# Patient Record
Sex: Female | Born: 2000 | Race: Black or African American | Hispanic: No | Marital: Single
Health system: Southern US, Community
[De-identification: ages and names within clinical notes are randomized; demographics above are authoritative.]

## PROBLEM LIST (undated history)

## (undated) DIAGNOSIS — Q600 Renal agenesis, unilateral: Secondary | ICD-10-CM

---
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
## 2020-04-21 ENCOUNTER — Inpatient Hospital Stay (HOSPITAL_COMMUNITY)
Admission: RE | Admit: 2020-04-21 | Discharge: 2020-04-24 | DRG: 885 | Disposition: A | Discharge status: Home / Self Care | Payer: Medicaid Other | Attending: Psychiatry | Admitting: Psychiatry

## 2020-04-21 DIAGNOSIS — R45851 Suicidal ideations: Secondary | ICD-10-CM | POA: Diagnosis present

## 2020-04-21 DIAGNOSIS — Z79899 Other long term (current) drug therapy: Secondary | ICD-10-CM

## 2020-04-21 DIAGNOSIS — Z7722 Contact with and (suspected) exposure to environmental tobacco smoke (acute) (chronic): Secondary | ICD-10-CM | POA: Diagnosis present

## 2020-04-21 DIAGNOSIS — Z818 Family history of other mental and behavioral disorders: Secondary | ICD-10-CM | POA: Diagnosis not present

## 2020-04-21 DIAGNOSIS — F332 Major depressive disorder, recurrent severe without psychotic features: Principal | ICD-10-CM | POA: Diagnosis present

## 2020-04-21 DIAGNOSIS — F4310 Post-traumatic stress disorder, unspecified: Secondary | ICD-10-CM | POA: Diagnosis present

## 2020-04-21 DIAGNOSIS — G4700 Insomnia, unspecified: Secondary | ICD-10-CM | POA: Diagnosis present

## 2020-04-21 DIAGNOSIS — Z20822 Contact with and (suspected) exposure to covid-19: Secondary | ICD-10-CM | POA: Diagnosis present

## 2020-04-21 MED ORDER — ALUM & MAG HYDROXIDE-SIMETH 200-200-20 MG/5ML PO SUSP: 30.0000 mL | ORAL | Status: DC | PRN

## 2020-04-21 MED ORDER — TRAZODONE HCL 50 MG PO TABS
50.0000 mg | ORAL_TABLET | Freq: Every evening | ORAL | Status: DC | PRN
  Administered 2020-04-22 – 2020-04-23 (×3): 50 mg via ORAL
  Filled 2020-04-21 (×4): qty 1

## 2020-04-21 MED ORDER — MAGNESIUM HYDROXIDE 400 MG/5ML PO SUSP: 30.0000 mL | Freq: Every day | ORAL | Status: DC | PRN

## 2020-04-21 MED ORDER — HYDROXYZINE HCL 25 MG PO TABS
25.0000 mg | ORAL_TABLET | Freq: Three times a day (TID) | ORAL | Status: DC | PRN
  Administered 2020-04-22 – 2020-04-24 (×5): 25 mg via ORAL
  Filled 2020-04-21 (×7): qty 1

## 2020-04-21 MED ORDER — ACETAMINOPHEN 325 MG PO TABS
650.0000 mg | ORAL_TABLET | Freq: Four times a day (QID) | ORAL | Status: DC | PRN
  Administered 2020-04-23 – 2020-04-24 (×2): 650 mg via ORAL
  Filled 2020-04-21 (×2): qty 2
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
## 2020-04-22 ENCOUNTER — Encounter (HOSPITAL_COMMUNITY): Payer: Self-pay | Admitting: Physician Assistant

## 2020-04-22 ENCOUNTER — Other Ambulatory Visit: Payer: Self-pay

## 2020-04-22 LAB — COMPREHENSIVE METABOLIC PANEL
ALT: 9 U/L (ref 0–44)
AST: 15 U/L (ref 15–41)
Albumin: 4.3 g/dL (ref 3.5–5.0)
Alkaline Phosphatase: 55 U/L (ref 38–126)
Anion gap: 8 (ref 5–15)
BUN: 18 mg/dL (ref 6–20)
CO2: 22 mmol/L (ref 22–32)
Calcium: 9.5 mg/dL (ref 8.9–10.3)
Chloride: 107 mmol/L (ref 98–111)
Creatinine, Ser: 0.87 mg/dL (ref 0.44–1.00)
GFR, Estimated: >60 mL/min (ref >60)
Glucose, Bld: 84 mg/dL (ref 70–99)
Potassium: 3.9 mmol/L (ref 3.5–5.1)
Sodium: 137 mmol/L (ref 135–145)
Total Bilirubin: 1.2 mg/dL (ref 0.3–1.2)
Total Protein: 7.9 g/dL (ref 6.5–8.1)

## 2020-04-22 LAB — LIPID PANEL
Cholesterol: 124 mg/dL (ref 0–200)
HDL: 28 mg/dL — ABNORMAL LOW (ref >40)
LDL Cholesterol: 83 mg/dL (ref 0–99)
Total CHOL/HDL Ratio: 4.4 RATIO
Triglycerides: 66 mg/dL (ref <150)
VLDL: 13 mg/dL (ref 0–40)

## 2020-04-22 LAB — HEMOGLOBIN A1C
Hgb A1c MFr Bld: 5.3 % (ref 4.8–5.6)
Mean Plasma Glucose: 105.41 mg/dL

## 2020-04-22 LAB — TSH: TSH: 2.768 u[IU]/mL (ref 0.350–4.500)

## 2020-04-22 LAB — RESP PANEL BY RT-PCR (FLU A&B, COVID) ARPGX2
Influenza A by PCR: NEGATIVE
Influenza B by PCR: NEGATIVE
SARS Coronavirus 2 by RT PCR: NEGATIVE

## 2020-04-22 MED ORDER — ESCITALOPRAM OXALATE 5 MG PO TABS
5.0000 mg | ORAL_TABLET | Freq: Every day | ORAL | Status: DC
  Administered 2020-04-22 – 2020-04-24 (×3): 5 mg via ORAL
  Filled 2020-04-22 (×4): qty 1

## 2020-04-22 MED ORDER — IBUPROFEN 600 MG PO TABS
600.0000 mg | ORAL_TABLET | Freq: Four times a day (QID) | ORAL | Status: DC | PRN
  Administered 2020-04-22 (×2): 600 mg via ORAL
  Filled 2020-04-22 (×2): qty 1

## 2020-04-22 NOTE — Progress Notes (Signed)
[facility] Group Notes:  (Nursing/MHT/Case Management/Adjunct)  Date:  04/22/2020  Time:  2015  Type of Therapy:  wrap up group  Participation Level:  Active  Participation Quality:  Appropriate, Attentive, Sharing and Supportive  Affect:  Anxious and Depressed  Cognitive:  Appropriate  Insight:  Improving  Engagement in Group:  Engaged  Modes of Intervention:  Clarification, Education and Support  Summary of Progress/Problems: Positive thinking and positive change were discussed.   Marcille Buffy 04/22/2020, 11:10 PM

## 2020-04-22 NOTE — Progress Notes (Signed)
   04/22/20 2245  COVID-19 Daily Checkoff  Have you had a fever (temp > 37.80C/100F)  in the past 24 hours?  No  If you have had runny nose, nasal congestion, sneezing in the past 24 hours, has it worsened? No  COVID-19 EXPOSURE  Have you traveled outside the state in the past 14 days? No  Have you been in contact with someone with a confirmed diagnosis of COVID-19 or PUI in the past 14 days without wearing appropriate PPE? No  Have you been living in the same home as a person with confirmed diagnosis of COVID-19 or a PUI (household contact)? No  Have you been diagnosed with COVID-19? No

## 2020-04-22 NOTE — Progress Notes (Signed)
D: Patient denies SI/HI/AVH. Pt. Rated anxiety 6/10 and denied depression. Pt. Complained of "period cramps" Pt. Had brief eye contact. A:  Patient took scheduled medicine. Pt had 600 mg of Ibuprofen for pain.  Support and encouragement provided Routine safety checks conducted every 15 minutes. Patient  Informed to notify staff with any concerns.  R: Safety maintained.
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
## 2020-04-22 NOTE — BHH Group Notes (Signed)
.  Psychoeducational Group Note  Date: 04/22/2020 Time: 0900-1000    Goal Setting   Purpose of Group: This group helps to provide patients with the steps of setting a goal that is specific, measurable, attainable, realistic and time specific. A discussion on how we keep ourselves stuck with negative self talk.    Participation Level:  Did not attend   Coleton Woon A  

## 2020-04-23 LAB — RAPID URINE DRUG SCREEN, HOSP PERFORMED
Amphetamines: NOT DETECTED
Barbiturates: NOT DETECTED
Benzodiazepines: NOT DETECTED
Cocaine: NOT DETECTED
Opiates: NOT DETECTED
Tetrahydrocannabinol: POSITIVE — AB

## 2020-04-23 LAB — PREGNANCY, URINE: Preg Test, Ur: NEGATIVE

## 2020-04-23 NOTE — Progress Notes (Signed)
D.  The patient became tearful after group and stated that she was sad because in group they spoke of valuing yourself and she did not feel that she was doing that.  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    A.  Writer encouraged patient, expressed how talented she is in her drawing and design.  Writer also praised Pt for coming in for help, and explained that this is also an example of self value.  Support and encouragement offered.  R.  Pt smiled at compliments, presently writing in journal before bed.  Will continue to monitor.

## 2020-04-23 NOTE — Progress Notes (Signed)
D:  Patient's self inventory sheet, patient sleeps good, sleep medication helpful.  Fair appetite, normal energy level, good concentration.  Rated depression and anxiety 4, hopeless 2.  Denied withdrawals.  Denied SI.  Denied physical problems.  Denied physical pain.  Goal is open up to MD, have a more positive attitude.  Plans to journal and sleep less during day.  Discharge and care plan?  No discharge plans. A:  Medications administered per MD orders.  Emotional support and encouragement given patient. R:  Denied SI and HI, contracts for safety.  Denied A/V hallucinations.  Safety maintained with 15 minute checks.

## 2020-04-23 NOTE — Plan of Care (Signed)
Nurse discussed anxiety, depression and coping skills with patient.  

## 2020-04-23 NOTE — BHH Group Notes (Signed)
Adult Psychoeducational Group Not Date:  04/23/2020 Time:  0900-1045 Group Topic/Focus: PROGRESSIVE RELAXATION. A group where deep breathing is taught and tensing and relaxation muscle groups is used. Imagery is used as well.  Pts are asked to imagine 3 pillars that hold them up when they are not able to hold themselves up.  Participation Level:  Did not attend  Thermon Zulauf A    
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
## 2020-04-23 NOTE — [facility] Suicide Risk Assessment (Signed)
[facility] INPATIENT:  Family/Significant Other Suicide Prevention Education  Suicide Prevention Education:  Education Completed; Mother Marice Potter [phone],  (name of family member/significant other) has been identified by the patient as the family member/significant other with whom the patient will be residing, and identified as the person(s) who will aid the patient in the event of a mental health crisis (suicidal ideations/suicide attempt).  With written consent from the patient, the family member/significant other has been provided the following suicide prevention education, prior to the and/or following the discharge of the patient.  The suicide prevention education provided includes the following:  Suicide risk factors  Suicide prevention and interventions  National Suicide Hotline telephone number  [facility] assessment telephone number  [facility] Emergency Assistance 911  [facility] and/or Residential Mobile Crisis Unit telephone number  Request made of family/significant other to:  Remove weapons (e.g., guns, rifles, knives), all items previously/currently identified as safety concern.    Remove drugs/medications (over-the-counter, prescriptions, illicit drugs), all items previously/currently identified as a safety concern.  The family member/significant other verbalizes understanding of the suicide prevention education information provided.  The family member/significant other agrees to remove the items of safety concern listed above.  Mother is not sure if patient is ready to discharge from the hospital, but knows that the patient has been focused on that since arriving.  She states that the patient has a lot of anxiety and depression and is usually quite unsettled without having a specific plan.  That is one reason she has been unhappy with treatment so far, because she does not feel she has gotten specific direction or information about her treatment plan  and her discharge plan.  Mother stated that she believes the patient will be more greatly benefited by having a specific psychiatric appointment in place rather than a walk-in because of the way her anxiety builds with the "unknown."  Mother states that if the hospital staff feels that the patient is ready to discharge, she will come pick her up, but she thinks it would probably be beneficial for her to stay longer.  Mother recognizes that she has been able to participate in very little of the group therapy so far, and thinks that because the medicine and therapy go hand in hand, it may be good for her to get more group therapy.  Further, she stated that the patient told her and the original assessor that if she did not get help she would end up killing herself.  For that reason, she is concerned that we take our time in assessing her and ensuring that she is truly stable and not rush her out, which is what it appears to her is happening.  She feels that we may be reacting to patient's desire to leave, and asks that we make sure before discharging her.    CSW assured mother that we will be the decision makers about discharge, and that we are not simply leaving the decision to her.  Also assured her that we will not be telling patient she has to stay because mother says she is not ready.  Carloyn Jaeger Grossman-Orr 04/23/2020, 11:09 AM

## 2020-04-23 NOTE — Progress Notes (Signed)
   04/23/20 2232  COVID-19 Daily Checkoff  Have you had a fever (temp > 37.80C/100F)  in the past 24 hours?  No  If you have had runny nose, nasal congestion, sneezing in the past 24 hours, has it worsened? No  COVID-19 EXPOSURE  Have you traveled outside the state in the past 14 days? No  Have you been in contact with someone with a confirmed diagnosis of COVID-19 or PUI in the past 14 days without wearing appropriate PPE? No  Have you been living in the same home as a person with confirmed diagnosis of COVID-19 or a PUI (household contact)? No  Have you been diagnosed with COVID-19? No

## 2020-04-23 NOTE — [facility] Suicide Risk Assessment (Signed)
[facility] Discharge Suicide Risk Assessment   Principal Problem: <principal problem not specified> Discharge Diagnoses: Active Problems:   MDD (major depressive disorder), recurrent episode, severe (HCC)   Total Time spent with patient: 20 minutes  Musculoskeletal: Strength & Muscle Tone: within normal limits Gait & Station: normal Patient leans: N/A  Psychiatric Specialty Exam: Review of Systems  All other systems reviewed and are negative.   Blood pressure 138/88, pulse 88, temperature 98.1 F (36.7 C), temperature source Oral, resp. rate 16, height 5\' 2"  (1.575 m), weight 65.8 kg, SpO2 97 %.Body mass index is 26.52 kg/m.  General Appearance: Casual  Eye Contact::  Fair  Speech:  Normal Rate409  Volume:  Normal  Mood:  Euthymic  Affect:  Congruent  Thought Process:  Coherent and Descriptions of Associations: Intact  Orientation:  Full (Time, Place, and Person)  Thought Content:  Logical  Suicidal Thoughts:  No  Homicidal Thoughts:  No  Memory:  Immediate;   Fair Recent;   Fair Remote;   Fair  Judgement:  Intact  Insight:  Fair  Psychomotor Activity:  Normal  Concentration:  Good  Recall:  Good  Fund of Knowledge:Good  Language: Good  Akathisia:  Negative  Handed:  Right  AIMS (if indicated):     Assets:  Desire for Improvement Housing Resilience Social Support  Sleep:  Number of Hours: 6  Cognition: WNL  ADL's:  Intact   Mental Status Per Nursing Assessment::   On Admission:  Suicidal ideation indicated by patient  Demographic Factors:  Unemployed  Loss Factors: NA  Historical Factors: Impulsivity  Risk Reduction Factors:   Living with another person, especially a relative and Positive social support  Continued Clinical Symptoms:  Depression:   Impulsivity  Cognitive Features That Contribute To Risk:  None    Suicide Risk:  Minimal: No identifiable suicidal ideation.  Patients presenting with no risk factors but with morbid ruminations; may be  classified as minimal risk based on the severity of the depressive symptoms    Plan Of Care/Follow-up recommendations:  Activity:  ad lib  [phone], MD 04/23/2020, 10:16 AM

## 2020-04-23 NOTE — [facility] Group Notes (Signed)
Psychoeducational Group Note  Date: 04-23-20 Time:  1300  Group Topic/Focus:  Making Healthy Choices:   The focus of this group is to help patients identify negative/unhealthy choices they were using prior to admission and identify positive/healthier coping strategies to replace them upon discharge.In this group, patients started asking about the brain and how the brain works with and how the chemicals work for those who use substances, the pros and cons of saboxone.  Participation Level:  Active  Participation Quality:  Appropriate  Affect:  Appropriate  Cognitive:  Oriented  Insight:  Improving  Engagement in Group:  Engaged  Additional Comments:rates her energy at a 7/10.Marland Kitchen  Partisipated fully in the group.  Dione Housekeeper

## 2020-04-23 NOTE — Progress Notes (Signed)
[facility] MD Progress Note  04/23/2020 2:37 PM Krystal Potts  MRN:  [id number] Subjective:  " I am doing well, I think I am ready to go home"  Objective:  Record reviewed, Nursing note reviewed and patient was interviewed this morning.  Patient is happy starting low dose Lexapro stating that she did not feel groggy this morning.  Patient reported 6-7 hours sleep last night and rated her depression and anxiety 5/10 with 10 being severe depression and anxiety.  Patient started participating in therapy and would continue therapy as she was doing before admission.  We discussed coping skills and the need to go back to school.  Patient states she feels much calmer and concentrated much better since last night.  She credits starting Lexapro to little improvement in her mood.  Patient also declined an increase in her Lexapro for now.  She denied feeling suicidal or having suicidal thoughts. We discussed the dangers associated with using Cannabis and taking prescribed medications.  Patient agreed but then states she only uses socially which provider advised her to stop using at all due to her intense anxiety.   Lab results were reviewed and all Normal.  Urine preg was ordered stat. Patient was evaluated by DR Jola Babinski for discharge today and he completed suicide risk assessment.  Patient's mother was hesitant letting her come home today due the intensity of her depression and anxiety before coming to the hospital.  Patient will be monitored one more day and discharged tomorrow. Principal Problem: <principal problem not specified> Diagnosis: Active Problems:   MDD (major depressive disorder), recurrent episode, severe (HCC)  Total Time spent with patient: 25 minutes  Past Psychiatric History:  PTSD, Depression and anxiety  Past Medical History:  Past Medical History:  Diagnosis Date  . Congenital absence of one kidney    Right present    Past Surgical History:  Procedure Laterality Date  . URETHRAL PROLAPSE  EXCISION  2008   Family History: History reviewed. No pertinent family history. Family Psychiatric  History: Denies Social History:  Social History   Substance and Sexual Activity  Alcohol Use No     Social History   Substance and Sexual Activity  Drug Use No    Social History   Socioeconomic History  . Marital status: Single    Spouse name: Not on file  . Number of children: Not on file  . Years of education: Not on file  . Highest education level: Not on file  Occupational History  . Not on file  Tobacco Use  . Smoking status: Passive Smoke Exposure - Never Smoker  . Smokeless tobacco: Never Used  Substance and Sexual Activity  . Alcohol use: No  . Drug use: No  . Sexual activity: Never  Other Topics Concern  . Not on file  Social History Narrative  . Not on file   Social Determinants of Health   Financial Resource Strain: Not on file  Food Insecurity: Not on file  Transportation Needs: Not on file  Physical Activity: Not on file  Stress: Not on file  Social Connections: Not on file   Additional Social History:    Pain Medications: None Prescriptions: None Over the Counter: None History of alcohol / drug use?: Yes Name of Substance 1: Marijuana 1 - Age of First Use: 20 years of age 21 - Amount (size/oz): Varies 1 - Frequency: When she is around friends 1 - Duration: off and on 1 - Last Use / Amount: Around birthday in  march                  Sleep: Good  Appetite:  Fair  Current Medications: Current Facility-Administered Medications  Medication Dose Route Frequency Provider Last Rate Last Admin  . acetaminophen (TYLENOL) tablet 650 mg  650 mg Oral Q6H PRN Nwoko, Uchenna E, PA      . alum & mag hydroxide-simeth (MAALOX/MYLANTA) 200-200-20 MG/5ML suspension 30 mL  30 mL Oral Q4H PRN Nwoko, Uchenna E, PA      . escitalopram (LEXAPRO) tablet 5 mg  5 mg Oral Daily Rontavious Albright C, NP   5 mg at 04/23/20 0742  . hydrOXYzine (ATARAX/VISTARIL)  tablet 25 mg  25 mg Oral TID PRN Nwoko, Uchenna E, PA   25 mg at 04/23/20 0743  . ibuprofen (ADVIL) tablet 600 mg  600 mg Oral Q6H PRN Nwoko, Uchenna E, PA   600 mg at 04/22/20 7616  . magnesium hydroxide (MILK OF MAGNESIA) suspension 30 mL  30 mL Oral Daily PRN Nwoko, Uchenna E, PA      . traZODone (DESYREL) tablet 50 mg  50 mg Oral QHS PRN Nwoko, Uchenna E, PA   50 mg at 04/22/20 2109    Lab Results:  Results for orders placed or performed during the hospital encounter of 04/21/20 (from the past 48 hour(s))  Resp Panel by RT-PCR (Flu A&B, Covid) Nasopharyngeal Swab     Status: None   Collection Time: 04/21/20 10:39 PM   Specimen: Nasopharyngeal Swab; Nasopharyngeal(NP) swabs in vial transport medium  Result Value Ref Range   SARS Coronavirus 2 by RT PCR NEGATIVE NEGATIVE    Comment: (NOTE) SARS-CoV-2 target nucleic acids are NOT DETECTED.  The SARS-CoV-2 RNA is generally detectable in upper respiratory specimens during the acute phase of infection. The lowest concentration of SARS-CoV-2 viral copies this assay can detect is 138 copies/mL. A negative result does not preclude SARS-Cov-2 infection and should not be used as the sole basis for treatment or other patient management decisions. A negative result may occur with  improper specimen collection/handling, submission of specimen other than nasopharyngeal swab, presence of viral mutation(s) within the areas targeted by this assay, and inadequate number of viral copies(<138 copies/mL). A negative result must be combined with clinical observations, patient history, and epidemiological information. The expected result is Negative.  Fact Sheet for Patients:  BloggerCourse.com  Fact Sheet for Healthcare Providers:  SeriousBroker.it  This test is no t yet approved or cleared by the [place] FDA and  has been authorized for detection and/or diagnosis of SARS-CoV-2 by FDA under an  Emergency Use Authorization (EUA). This EUA will remain  in effect (meaning this test can be used) for the duration of the COVID-19 declaration under Section 564(b)(1) of the Act, 21 U.S.C.section 360bbb-3(b)(1), unless the authorization is terminated  or revoked sooner.       Influenza A by PCR NEGATIVE NEGATIVE   Influenza B by PCR NEGATIVE NEGATIVE    Comment: (NOTE) The Xpert Xpress SARS-CoV-2/FLU/RSV plus assay is intended as an aid in the diagnosis of influenza from Nasopharyngeal swab specimens and should not be used as a sole basis for treatment. Nasal washings and aspirates are unacceptable for Xpert Xpress SARS-CoV-2/FLU/RSV testing.  Fact Sheet for Patients: BloggerCourse.com  Fact Sheet for Healthcare Providers: SeriousBroker.it  This test is not yet approved or cleared by the [place] FDA and has been authorized for detection and/or diagnosis of SARS-CoV-2 by FDA under an Emergency Use Authorization (EUA).  This EUA will remain in effect (meaning this test can be used) for the duration of the COVID-19 declaration under Section 564(b)(1) of the Act, 21 U.S.C. section 360bbb-3(b)(1), unless the authorization is terminated or revoked.  Performed at [facility], [address] [place], [place] [address]   Comprehensive metabolic panel     Status: None   Collection Time: 04/22/20  4:34 PM  Result Value Ref Range   Sodium 137 135 - 145 mmol/L   Potassium 3.9 3.5 - 5.1 mmol/L   Chloride 107 98 - 111 mmol/L   CO2 22 22 - 32 mmol/L   Glucose, Bld 84 70 - 99 mg/dL    Comment: Glucose reference range applies only to samples taken after fasting for at least 8 hours.   BUN 18 6 - 20 mg/dL   Creatinine, Ser 7.820.87 0.44 - 1.00 mg/dL   Calcium 9.5 8.9 - 95.610.3 mg/dL   Total Protein 7.9 6.5 - 8.1 g/dL   Albumin 4.3 3.5 - 5.0 g/dL   AST 15 15 - 41 U/L   ALT 9 0 - 44 U/L   Alkaline Phosphatase 55 38 -  126 U/L   Total Bilirubin 1.2 0.3 - 1.2 mg/dL   GFR, Estimated >21>60 >30>60 mL/min    Comment: (NOTE) Calculated using the CKD-EPI Creatinine Equation (2021)    Anion gap 8 5 - 15    Comment: Performed at [facility], [address] [place], [place] [address]  Hemoglobin A1c     Status: None   Collection Time: 04/22/20  4:34 PM  Result Value Ref Range   Hgb A1c MFr Bld 5.3 4.8 - 5.6 %    Comment: (NOTE) Pre diabetes:          5.7%-6.4%  Diabetes:              >6.4%  Glycemic control for   <7.0% adults with diabetes    Mean Plasma Glucose 105.41 mg/dL    Comment: Performed at [facility], [address] [place], [place] [address]  Lipid panel     Status: Abnormal   Collection Time: 04/22/20  4:34 PM  Result Value Ref Range   Cholesterol 124 0 - 200 mg/dL   Triglycerides 66 <952<150 mg/dL   HDL 28 (L) >84>40 mg/dL   Total CHOL/HDL Ratio 4.4 RATIO   VLDL 13 0 - 40 mg/dL   LDL Cholesterol 83 0 - 99 mg/dL    Comment:        Total Cholesterol/HDL:CHD Risk Coronary Heart Disease Risk Table                     Men   Women  1/2 Average Risk   3.4   3.3  Average Risk       5.0   4.4  2 X Average Risk   9.6   7.1  3 X Average Risk  23.4   11.0        Use the calculated Patient Ratio above and the CHD Risk Table to determine the patient's CHD Risk.        ATP III CLASSIFICATION (LDL):  <100     mg/dL   Optimal  132-440100-129  mg/dL   Near or Above                    Optimal  130-159  mg/dL   Borderline  102-725160-189  mg/dL   High  >  190     mg/dL   Very High Performed at [facility], [address], [place], [place] [address]   TSH     Status: None   Collection Time: 04/22/20  4:34 PM  Result Value Ref Range   TSH 2.768 0.350 - 4.500 uIU/mL    Comment: Performed by a 3rd Generation assay with a functional sensitivity of <=0.01 uIU/mL. Performed at [facility], [address], [place], [place] [address]     Blood Alcohol  level:  No results found for: [facility]  Metabolic Disorder Labs: Lab Results  Component Value Date   HGBA1C 5.3 04/22/2020   MPG 105.41 04/22/2020   No results found for: PROLACTIN Lab Results  Component Value Date   CHOL 124 04/22/2020   TRIG 66 04/22/2020   HDL 28 (L) 04/22/2020   CHOLHDL 4.4 04/22/2020   VLDL 13 04/22/2020   LDLCALC 83 04/22/2020    Physical Findings: AIMS: Facial and Oral Movements Muscles of Facial Expression: None, normal Lips and Perioral Area: None, normal Jaw: None, normal Tongue: None, normal,Extremity Movements Upper (arms, wrists, hands, fingers): None, normal Lower (legs, knees, ankles, toes): None, normal, Trunk Movements Neck, shoulders, hips: None, normal, Overall Severity Severity of abnormal movements (highest score from questions above): None, normal Incapacitation due to abnormal movements: None, normal Patient's awareness of abnormal movements (rate only patient's report): No Awareness, Dental Status Current problems with teeth and/or dentures?: No Does patient usually wear dentures?: No  CIWA:    COWS:     Musculoskeletal: Strength & Muscle Tone: within normal limits Gait & Station: normal Patient leans: Front  Psychiatric Specialty Exam:  Presentation  General Appearance: Appropriate for Environment; Neat  Eye Contact:Good  Speech:Clear and Coherent; Normal Rate  Speech Volume:Normal  Handedness:Right   Mood and Affect  Mood:Anxious; Depressed; Dysphoric; Worthless  Affect:Congruent; Depressed   Thought Process  Thought Processes:Coherent; Goal Directed  Descriptions of Associations:Intact  Orientation:Full (Time, Place and Person)  Thought Content:Logical; WDL  History of Schizophrenia/Schizoaffective disorder:No data recorded Duration of Psychotic Symptoms:No data recorded Hallucinations:No data recorded Ideas of Reference:None  Suicidal Thoughts:No data recorded Homicidal Thoughts:No data  recorded  Sensorium  Memory:Immediate Good; Recent Good; Remote Good  Judgment:Good  Insight:Good   Executive Functions  Concentration:Good  Attention Span:Good  Recall:Good  Fund of Knowledge:Good  Language:Good   Psychomotor Activity  Psychomotor Activity:No data recorded  Assets  Assets:Communication Skills; Desire for Improvement; Housing; Social Support   Sleep  Sleep:No data recorded   Physical Exam: Physical Exam Vitals and nursing note reviewed.  Constitutional:      Appearance: Normal appearance.  HENT:     Head: Normocephalic and atraumatic.  Cardiovascular:     Rate and Rhythm: Normal rate and regular rhythm.     Pulses: Normal pulses.  Pulmonary:     Effort: Pulmonary effort is normal.  Musculoskeletal:     Cervical back: Normal range of motion.  Neurological:     Mental Status: She is alert.    Review of Systems  Constitutional: Negative.   HENT: Negative.   Eyes: Negative.   Respiratory: Negative.   Cardiovascular: Negative.   Genitourinary: Negative.   Skin: Negative.   Endo/Heme/Allergies: Negative.   Psychiatric/Behavioral: Positive for depression. The patient is nervous/anxious.    Blood pressure 138/88, pulse 88, temperature 98.1 F (36.7 C), temperature source Oral, resp. rate 16, height 5\' 2"  (1.575 m), weight 65.8 kg, SpO2 97 %. Body mass index is 26.52 kg/m.  Treatment Plan Summary: Daily contact with patient to assess and evaluate symptoms and progress in treatment and Medication management  -Plan: -Continue  Escitalopram 5 mg po daily for depression and anxiety -Offer Trazodone 50 mg po at bed time for sleep -Encourage group and activity participation while in the unit -Plan for discharge tomorrow Earney Navy, NP-PMHNP-BC 04/23/2020, 2:37 PM

## 2020-04-24 DIAGNOSIS — F332 Major depressive disorder, recurrent severe without psychotic features: Principal | ICD-10-CM

## 2020-04-24 MED ORDER — ESCITALOPRAM OXALATE 10 MG PO TABS: 10.0000 mg | ORAL_TABLET | Freq: Every day | ORAL | 0 refills | Status: AC

## 2020-04-24 MED ORDER — TRAZODONE HCL 50 MG PO TABS: 50.0000 mg | ORAL_TABLET | Freq: Every evening | ORAL | 0 refills | Status: AC | PRN

## 2020-04-24 MED ORDER — ESCITALOPRAM OXALATE 10 MG PO TABS
10.0000 mg | ORAL_TABLET | Freq: Every day | ORAL | Status: DC
  Filled 2020-04-24 (×2): qty 1

## 2020-04-24 MED ORDER — HYDROXYZINE HCL 25 MG PO TABS: 25.0000 mg | ORAL_TABLET | Freq: Three times a day (TID) | ORAL | 0 refills | Status: AC | PRN
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
## 2020-04-24 NOTE — Progress Notes (Signed)
Discharge Note:  Patient denies SI/HI AVH at this time. Discharge instructions, AVS, prescriptions and transition record gone over with patient. Patient agrees to comply with medication management, follow-up visit, and outpatient therapy. Patient belongings returned to patient. Patient questions and concerns addressed and answered.  Patient ambulatory off unit.  Patient discharged to home with friend.   

## 2020-04-24 NOTE — Progress Notes (Signed)
Recreation Therapy Notes  Date: 4.11.22 Time: 0930 Location: [address] Dayroom  Group Topic: Stress Management   Goal Area(s) Addresses:  Patient will actively participate in stress management techniques presented during session.  Patient will successfully identify benefit of practicing stress management post d/c.   Behavioral Response: Appropriate  Intervention: Guided exercise with ambient sound and script  Activity :Guided Imagery  LRT read a script that focused envisioning being in a meadow in the summer and enjoying the sights, sounds and feeling of being outdoors.  Patients were to listen and follow along as script was read to fully engage in activity.   Education:  Stress Management, Discharge Planning.   Education Outcome: Acknowledges education  Clinical Observations/Feedback: Patient attended and participated in activity.   Caroll Rancher, LRT/CTRS    Lillia Abed, [employer] [facility] 04/24/2020 10:45 AM

## 2020-04-24 NOTE — BHH Group Notes (Signed)
LCSW Group Therapy Note  Type of Therapy/Topic: Group Therapy: Six Dimensions of Wellness  Participation Level: Did Not Attend  Description of Group: This group will address the concept of wellness and the six concepts of wellness: occupational, physical, social, intellectual, spiritual, and emotional. Patients will be encouraged to process areas in their lives that are out of balance and identify reasons for remaining unbalanced. Patients will be encouraged to explore ways to practice healthy habits daily to attain better physical and mental health outcomes.  Therapeutic Goals: 1. Identify aspects of wellness that they are doing well. 2. Identify aspects of wellness that they would like to improve upon. 3. Identify one action they can take to improve an aspect of wellness in their lives.     Therapeutic Modalities: Cognitive Behavioral Therapy Solution-Focused Therapy Relapse Prevention   Cylas Falzone, LCSWA Clinicial Social Worker [facility]  

## 2020-04-24 NOTE — Progress Notes (Signed)
  [facility] Adult Case Management Discharge Plan :  Will you be returning to the same living situation after discharge:  Yes,  mother's home At discharge, do you have transportation home?: Yes,  mother Do you have the ability to pay for your medications: Yes,  medicaid  Release of information consent forms completed and in the chart;  Patient's signature needed at discharge.  Patient to Follow up at:  Follow-up Information    [facility], [facility] [place]. Go on 04/27/2020.   Why: You have a hospital follow up appointment for therapy and medication management services on 04/27/20 on 1:00 pm.  This appointment will be held in person. Contact information: [address] [place] [address] [phone]               Next level of care provider has access to [facility] Link:no  Safety Planning and Suicide Prevention discussed: Yes,  w/ mother     Has patient been referred to the Quitline?: Patient refused referral  Patient has been referred for addiction treatment: N/A  Felizardo Hoffmann, Theresia Majors 04/24/2020, 10:29 AM

## 2020-04-24 NOTE — BHH Suicide Risk Assessment (Signed)
[facility] Discharge Suicide Risk Assessment   Principal Problem: <principal problem not specified> Discharge Diagnoses: Active Problems:   MDD (major depressive disorder), recurrent episode, severe (HCC)   Total Time spent with patient: 20 minutes  Musculoskeletal: Strength & Muscle Tone: within normal limits Gait & Station: normal Patient leans: N/A  Psychiatric Specialty Exam: Review of Systems  All other systems reviewed and are negative.   Blood pressure 122/64, pulse 64, temperature 98.1 F (36.7 C), temperature source Oral, resp. rate 16, height 5\' 2"  (1.575 m), weight 65.8 kg, SpO2 100 %.Body mass index is 26.52 kg/m.  General Appearance: Fairly Groomed  ::  Fair  Speech:  Normal Rate409  Volume:  Normal  Mood:  Anxious  Affect:  Congruent  Thought Process:  Coherent and Descriptions of Associations: Intact  Orientation:  Full (Time, Place, and Person)  Thought Content:  Logical  Suicidal Thoughts:  No  Homicidal Thoughts:  No  Memory:  Immediate;   Good Recent;   Good Remote;   Good  Judgement:  Intact  Insight:  Fair  Psychomotor Activity:  Normal  Concentration:  Fair  Recall:  [id number] of Knowledge:Fair  Language: Good  Akathisia:  Negative  Handed:  Right  AIMS (if indicated):     Assets:  Communication Skills Desire for Improvement Housing Resilience Talents/Skills  Sleep:  Number of Hours: 6.25  Cognition: WNL  ADL's:  Intact   Mental Status Per Nursing Assessment::   On Admission:  Suicidal ideation indicated by patient  Demographic Factors:  Low socioeconomic status and Unemployed  Loss Factors: Financial problems/change in socioeconomic status  Historical Factors: Impulsivity  Risk Reduction Factors:   Sense of responsibility to family and Living with another person, especially a relative  Continued Clinical Symptoms:  Depression:   Impulsivity  Cognitive Features That Contribute To Risk:  None    Suicide Risk:   Minimal: No identifiable suicidal ideation.  Patients presenting with no risk factors but with morbid ruminations; may be classified as minimal risk based on the severity of the depressive symptoms   Follow-up Information    [facility], [facility]. Go on 04/27/2020.   Why: You have a hospital follow up appointment for therapy and medication management services on 04/27/20 on 1:00 pm.  This appointment will be held in person. Contact information: [address] [place] [phone]               Plan Of Care/Follow-up recommendations:  Activity:  ad lib  [phone], MD 04/24/2020, 9:57 AM

## 2020-04-24 NOTE — BHH Group Notes (Signed)
ADULT GRIEF GROUP NOTE:   Spiritual care group on grief and loss facilitated by Kathleen Argue, [facility]   Group Goal:   Support / Education around grief and loss   Members engage in facilitated group support and psycho-social education.   Group Description:   Following introductions and group rules, group members engaged in facilitated group dialog and support around topic of loss, with particular support around experiences of loss in their lives. Group Identified types of loss (relationships / self / things) and identified patterns, circumstances, and changes that precipitate losses. Reflected on thoughts / feelings around loss, normalized grief responses, and recognized variety in grief experience. Group noted Worden's four tasks of grief in discussion.   Group drew on Adlerian / Rogerian, narrative, MI,   Patient Progress: Krystal Potts attended and participated in group.  She shared about the loss of a sibling to SIDs and also shared that she is trying to change her living circumstances to better care for herself but needs to be able to save money in order to move out of her home.  [address] Krystal Potts, B[facility] Pager, [phone] Office: [phone] 3:08 PM

## 2020-04-24 NOTE — Tx Team (Signed)
Interdisciplinary Treatment and Diagnostic Plan Update  04/24/2020 Time of Session: 10:30am Krystal Potts MRN: [id number]  Principal Diagnosis: <principal problem not specified>  Secondary Diagnoses: Active Problems:   MDD (major depressive disorder), recurrent episode, severe (HCC)   Current Medications:  Current Facility-Administered Medications  Medication Dose Route Frequency Provider Last Rate Last Admin  . acetaminophen (TYLENOL) tablet 650 mg  650 mg Oral Q6H PRN Nwoko, Uchenna E, PA   650 mg at 04/24/20 0806  . alum & mag hydroxide-simeth (MAALOX/MYLANTA) 200-200-20 MG/5ML suspension 30 mL  30 mL Oral Q4H PRN Nwoko, Uchenna E, PA      . [START ON 04/25/2020] escitalopram (LEXAPRO) tablet 10 mg  10 mg Oral Daily Sharma Covert, MD      . hydrOXYzine (ATARAX/VISTARIL) tablet 25 mg  25 mg Oral TID PRN Nwoko, Uchenna E, PA   25 mg at 04/24/20 0807  . ibuprofen (ADVIL) tablet 600 mg  600 mg Oral Q6H PRN Nwoko, Uchenna E, PA   600 mg at 04/22/20 1287  . magnesium hydroxide (MILK OF MAGNESIA) suspension 30 mL  30 mL Oral Daily PRN Nwoko, Uchenna E, PA      . traZODone (DESYREL) tablet 50 mg  50 mg Oral QHS PRN Nwoko, Uchenna E, PA   50 mg at 04/23/20 2127   PTA Medications: No medications prior to admission.    Patient Stressors:    Patient Strengths:    Treatment Modalities: Medication Management, Group therapy, Case management,  1 to 1 session with clinician, Psychoeducation, Recreational therapy.   Physician Treatment Plan for Primary Diagnosis: <principal problem not specified> Long Term Goal(s): Improvement in symptoms so as ready for discharge Improvement in symptoms so as ready for discharge   Short Term Goals: Ability to identify changes in lifestyle to reduce recurrence of condition will improve Ability to verbalize feelings will improve Ability to disclose and discuss suicidal ideas Ability to demonstrate self-control will improve Ability to identify and develop  effective coping behaviors will improve Ability to maintain clinical measurements within normal limits will improve Compliance with prescribed medications will improve Ability to identify triggers associated with substance abuse/mental health issues will improve Ability to identify changes in lifestyle to reduce recurrence of condition will improve Ability to verbalize feelings will improve Ability to disclose and discuss suicidal ideas Ability to demonstrate self-control will improve Ability to identify and develop effective coping behaviors will improve Ability to maintain clinical measurements within normal limits will improve Compliance with prescribed medications will improve Ability to identify triggers associated with substance abuse/mental health issues will improve  Medication Management: Evaluate patient's response, side effects, and tolerance of medication regimen.  Therapeutic Interventions: 1 to 1 sessions, Unit Group sessions and Medication administration.  Evaluation of Outcomes: Adequate for Discharge  Physician Treatment Plan for Secondary Diagnosis: Active Problems:   MDD (major depressive disorder), recurrent episode, severe ([place])  Long Term Goal(s): Improvement in symptoms so as ready for discharge Improvement in symptoms so as ready for discharge   Short Term Goals: Ability to identify changes in lifestyle to reduce recurrence of condition will improve Ability to verbalize feelings will improve Ability to disclose and discuss suicidal ideas Ability to demonstrate self-control will improve Ability to identify and develop effective coping behaviors will improve Ability to maintain clinical measurements within normal limits will improve Compliance with prescribed medications will improve Ability to identify triggers associated with substance abuse/mental health issues will improve Ability to identify changes in lifestyle to reduce recurrence of condition  will  improve Ability to verbalize feelings will improve Ability to disclose and discuss suicidal ideas Ability to demonstrate self-control will improve Ability to identify and develop effective coping behaviors will improve Ability to maintain clinical measurements within normal limits will improve Compliance with prescribed medications will improve Ability to identify triggers associated with substance abuse/mental health issues will improve     Medication Management: Evaluate patient's response, side effects, and tolerance of medication regimen.  Therapeutic Interventions: 1 to 1 sessions, Unit Group sessions and Medication administration.  Evaluation of Outcomes: Met   RN Treatment Plan for Primary Diagnosis: <principal problem not specified> Long Term Goal(s): Knowledge of disease and therapeutic regimen to maintain health will improve  Short Term Goals: Ability to verbalize feelings will improve and Ability to identify and develop effective coping behaviors will improve  Medication Management: RN will administer medications as ordered by provider, will assess and evaluate patient's response and provide education to patient for prescribed medication. RN will report any adverse and/or side effects to prescribing provider.  Therapeutic Interventions: 1 on 1 counseling sessions, Psychoeducation, Medication administration, Evaluate responses to treatment, Monitor vital signs and CBGs as ordered, Perform/monitor CIWA, COWS, AIMS and Fall Risk screenings as ordered, Perform wound care treatments as ordered.  Evaluation of Outcomes: Met   LCSW Treatment Plan for Primary Diagnosis: <principal problem not specified> Long Term Goal(s): Safe transition to appropriate next level of care at discharge, Engage patient in therapeutic group addressing interpersonal concerns.  Short Term Goals: Engage patient in aftercare planning with referrals and resources, Increase social support, Increase ability to  appropriately verbalize feelings, Facilitate acceptance of mental health diagnosis and concerns, Identify triggers associated with mental health/substance abuse issues and Increase skills for wellness and recovery  Therapeutic Interventions: Assess for all discharge needs, 1 to 1 time with Social worker, Explore available resources and support systems, Assess for adequacy in community support network, Educate family and significant other(s) on suicide prevention, Complete Psychosocial Assessment, Interpersonal group therapy.  Evaluation of Outcomes: Met   Progress in Treatment: Attending groups: Yes. Participating in groups: Yes. Taking medication as prescribed: Yes. Toleration medication: Yes. Family/Significant other contact made: Yes, individual(s) contacted:  Mother Haze Justin [phone] Patient understands diagnosis: Yes. Discussing patient identified problems/goals with staff: Yes. Medical problems stabilized or resolved: Yes. Denies suicidal/homicidal ideation: Yes. Issues/concerns per patient self-inventory: No.   New problem(s) identified: No, Describe:  N/A  New Short Term/Long Term Goal(s):   medication stabilization, elimination of SI thoughts, development of comprehensive mental wellness plan.   Patient Goals:  Patient discussed that she wanted time to focus on herself and take care of herself  Discharge Plan or Barriers: Patient recently admitted. CSW will continue to follow and assess for appropriate referrals and possible discharge planning.    Reason for Continuation of Hospitalization: Anxiety Depression  Estimated Length of Stay: 1-2 days  Attendees: Patient: Krystal Potts 04/24/2020   Physician: Dr. Ethelene Browns, MD 04/24/2020   Nursing:  04/24/2020   RN Care Manager: 04/24/2020   Social Worker: Riki Altes, LCSW 04/24/2020   Recreational Therapist:  04/24/2020   Other:  04/24/2020   Other:  04/24/2020   Other: 04/24/2020     Scribe for Treatment  Team: Zachery Conch, LCSW 04/24/2020 11:04 AM

## 2020-04-24 NOTE — Progress Notes (Incomplete)
   04/24/20 0641  Vital Signs  Pulse Rate 64  BP 122/64  BP Location Right Arm  BP Method Automatic  Patient Position (if appropriate) Standing

## 2020-05-21 ENCOUNTER — Other Ambulatory Visit (HOSPITAL_COMMUNITY): Payer: Self-pay | Admitting: Psychiatry
